# Patient Record
Sex: Male | Born: 1990 | Race: White | Marital: Single | Smoking: Current some day smoker
Health system: Southern US, Community
[De-identification: ages and names within clinical notes are randomized; demographics above are authoritative.]

---

## 2010-12-22 ENCOUNTER — Other Ambulatory Visit: Payer: Self-pay | Admitting: Family Medicine

## 2010-12-22 DIAGNOSIS — M79671 Pain in right foot: Secondary | ICD-10-CM

## 2010-12-22 DIAGNOSIS — M25571 Pain in right ankle and joints of right foot: Secondary | ICD-10-CM

## 2010-12-24 ENCOUNTER — Ambulatory Visit
Admission: RE | Admit: 2010-12-24 | Discharge: 2010-12-24 | Disposition: A | Discharge status: Home / Self Care | Payer: BC Managed Care – PPO | Source: Ambulatory Visit | Attending: Family Medicine | Admitting: Family Medicine

## 2010-12-24 ENCOUNTER — Other Ambulatory Visit: Payer: Self-pay | Admitting: Family Medicine

## 2010-12-24 DIAGNOSIS — M25571 Pain in right ankle and joints of right foot: Secondary | ICD-10-CM

## 2010-12-24 DIAGNOSIS — M79671 Pain in right foot: Secondary | ICD-10-CM

## 2011-11-05 IMAGING — CT CT ANKLE*R* W/O CM
4 series · 17 of 35 positions shown, 19 images · non-contrast
Comparison: None.

CLINICAL DATA: Right foot and ankle pain.  Question posterior
talus fracture.

CT RIGHT FOOT WITHOUT CONTRAST
TECHNIQUE: Multidetector CT imaging of the right foot was
performed according to the standard protocol without intravenous
contrast. Multiplanar CT image reconstructions were also generated.

[Series 2: ankle/foot bone · axial · 0.59mm/px · z∈[-47,+91]mm · 5 of 83 slices shown, 7 images]
[im 14/83  soft-tissue]
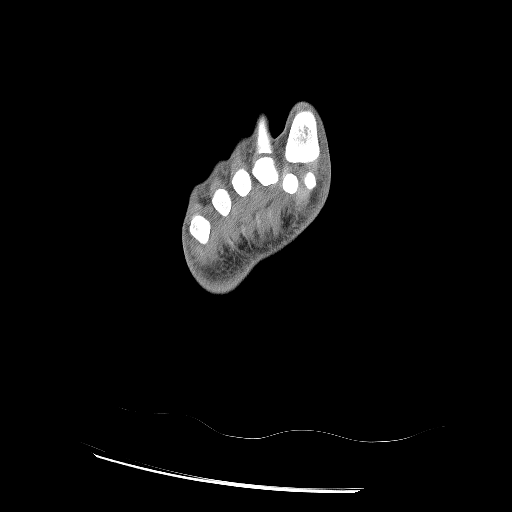
[im 14/83  bone]
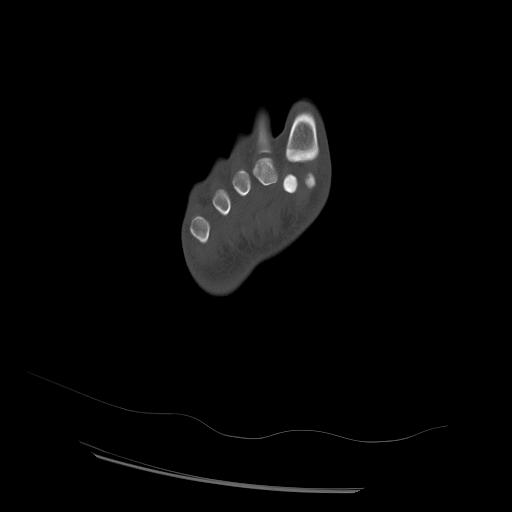
[im 28/83  bone]
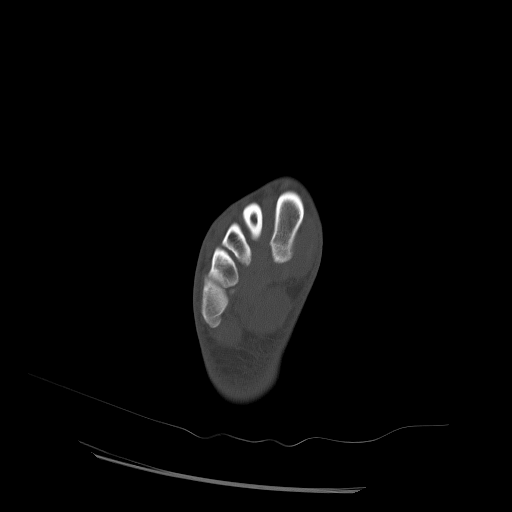
[im 42/83  bone]
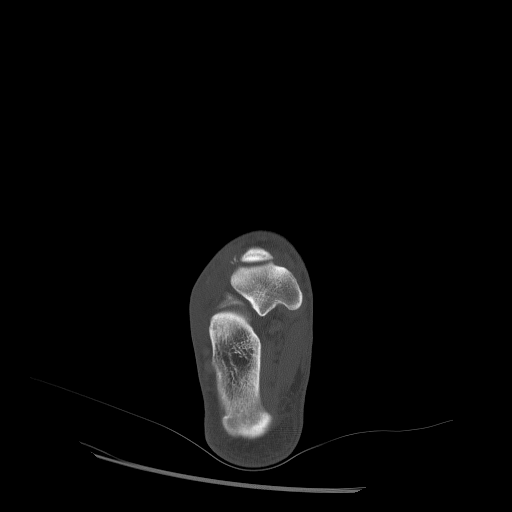
[im 55/83  bone]
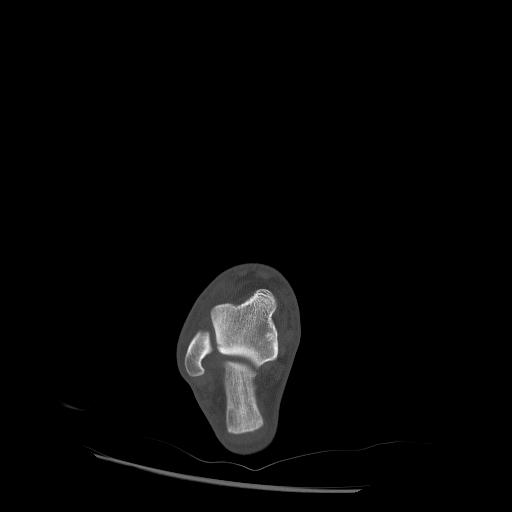
[im 69/83  soft-tissue]
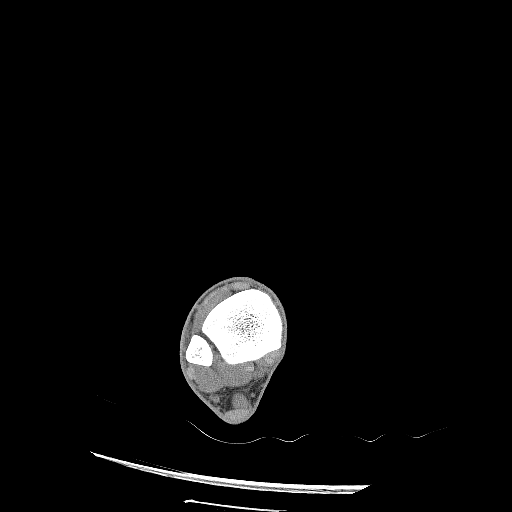
[im 69/83  bone]
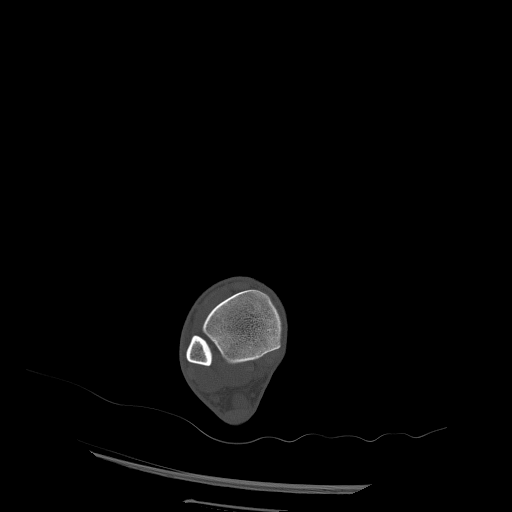

[Series 3: ankle /foot detail · axial · 0.59mm/px · z∈[-47,+23]mm · 3 of 83 slices shown]
[im 14/83  bone]
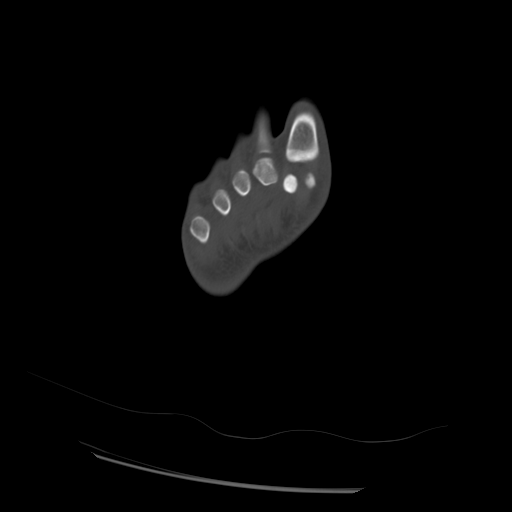
[im 28/83  bone]
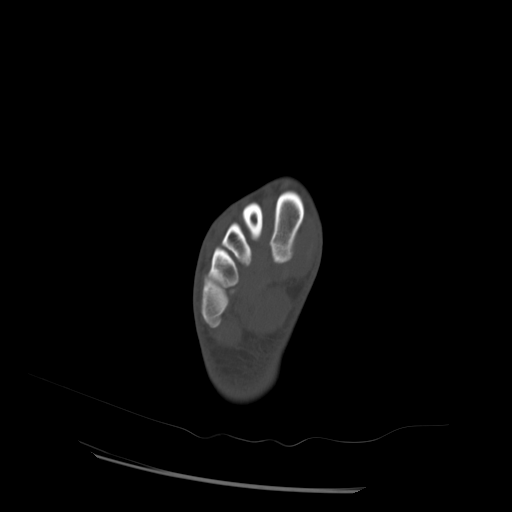
[im 42/83  bone]
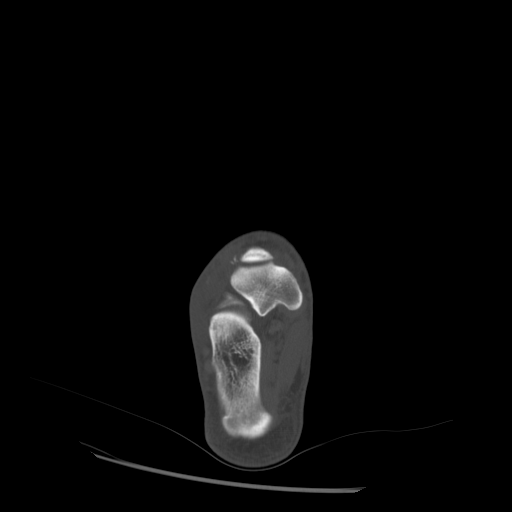

[Series 400: sagittal · sagittal · 0.59mm/px · 6 of 49 slices shown]
[im 17/49  bone]
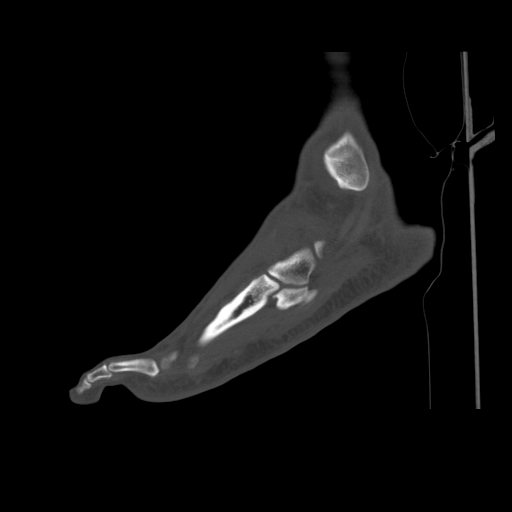
[im 21/49  bone]
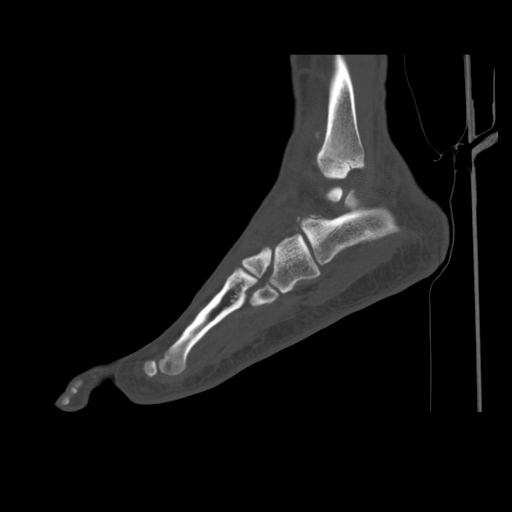
[im 25/49  bone]
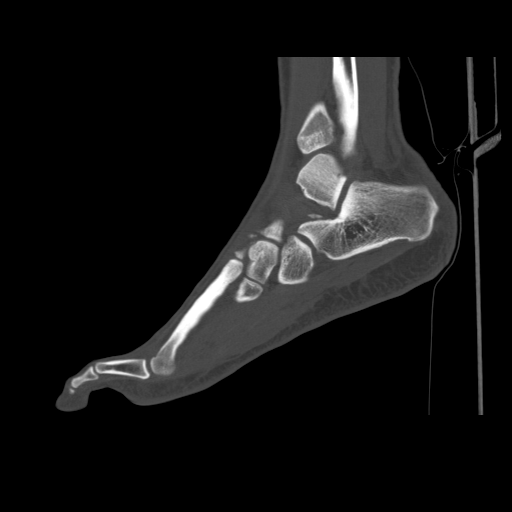
[im 29/49  bone]
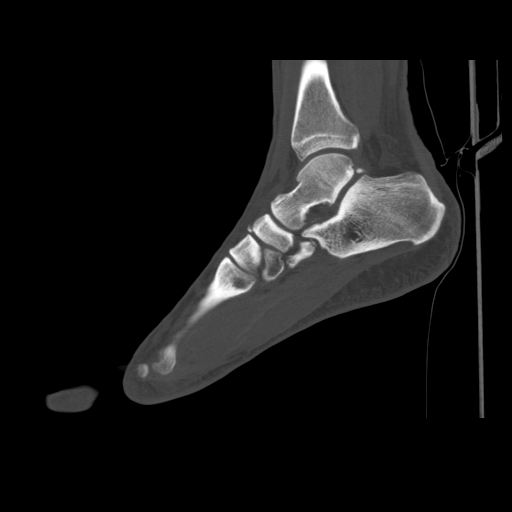
[im 30/49  soft-tissue]
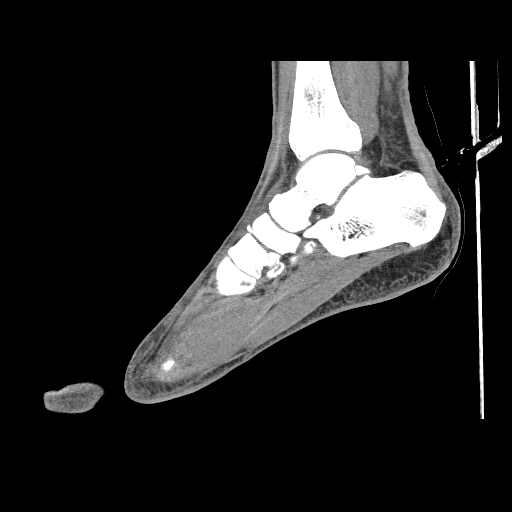
[im 33/49  bone]
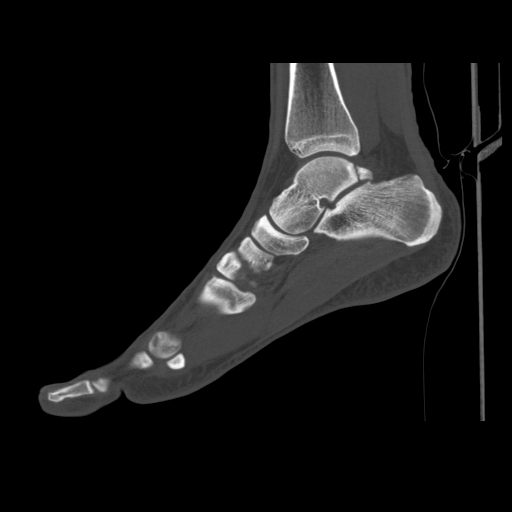

[Series 402: coronal ankle · coronal · 0.39mm/px · 3 of 55 slices shown]
[im 11/55  bone]
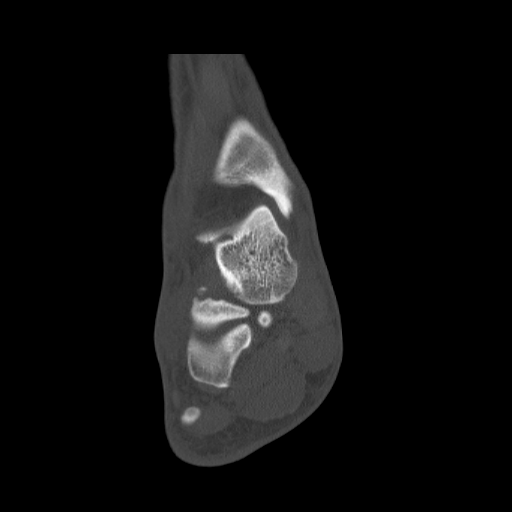
[im 22/55  bone]
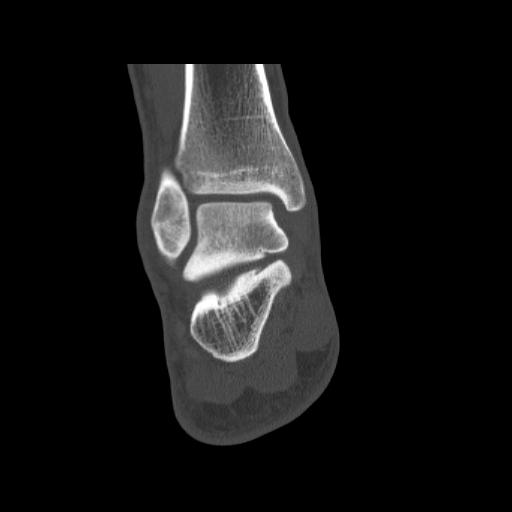
[im 33/55  bone]
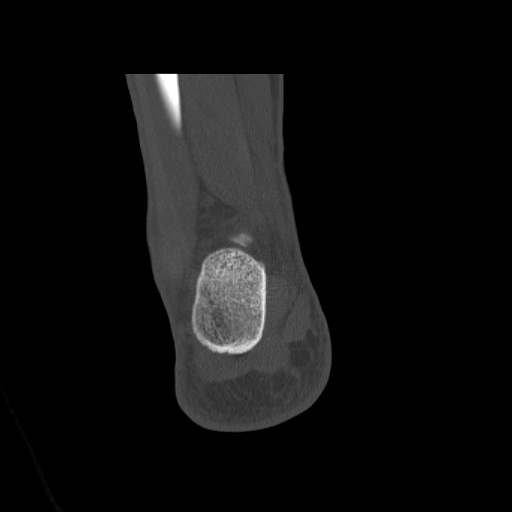

[17 of 35 positions shown; findings below may reference images not displayed]

FINDINGS: There is a cluster of bone fragments in the sinus tarsi
extending lateral to the middle subtalar joint.  These arise from
the anterior process of the calcaneus.

There is also a small acute chip fracture off the dorsal distal
navicular bone.  The talus is intact with a small os trigonum and
mild sclerosis along the synchondrosis.  The talar dome is intact.
The tibial plafond appears within normal limits.  Achilles tendon
and plantar fascia grossly normal by CT.

The posterior subtalar joint appears normal.

The forefoot appears within normal limits.
IMPRESSION: 1.  Minimally displaced small comminuted fracture of the anterior
process of the calcaneus with a small bone fragment in the sinus
tarsi measuring 5 mm x 80 mm x 6 mm.
2.  Small chip fracture of the dorsal distal navicular bone.

## 2013-10-12 ENCOUNTER — Emergency Department (INDEPENDENT_AMBULATORY_CARE_PROVIDER_SITE_OTHER)
Admission: EM | Admit: 2013-10-12 | Discharge: 2013-10-12 | Disposition: A | Discharge status: Home / Self Care | Payer: PRIVATE HEALTH INSURANCE | Source: Home / Self Care

## 2013-10-12 ENCOUNTER — Encounter (HOSPITAL_COMMUNITY): Payer: Self-pay | Admitting: Emergency Medicine

## 2013-10-12 DIAGNOSIS — J111 Influenza due to unidentified influenza virus with other respiratory manifestations: Secondary | ICD-10-CM

## 2013-10-12 NOTE — ED Provider Notes (Signed)
CSN: [id number]     Arrival date & time 10/12/13  [id number] History   First MD Initiated Contact with Patient 10/12/13 [phone]     Chief Complaint  Patient presents with  . URI   (Consider location/radiation/quality/duration/timing/severity/associated sxs/prior Treatment) HPI Comments: Patient states he has flulike symptoms.   History reviewed. No pertinent past medical history. No past surgical history on file. No family history on file. History  Substance Use Topics  . Smoking status: Not on file  . Smokeless tobacco: Not on file  . Alcohol Use: Not on file    Review of Systems  Constitutional: Positive for activity change, appetite change and fatigue. Negative for fever and diaphoresis.  HENT: Positive for postnasal drip and sore throat. Negative for ear pain, facial swelling, rhinorrhea and trouble swallowing.   Eyes: Negative for pain, discharge and redness.  Respiratory: Positive for cough. Negative for chest tightness and shortness of breath.   Cardiovascular: Negative.   Gastrointestinal: Negative.   Musculoskeletal: Negative.  Negative for neck pain and neck stiffness.  Skin: Negative for rash.  Neurological: Negative.     Allergies  Review of patient's allergies indicates no known allergies.  Home Medications  No current outpatient prescriptions on file. BP 144/86  Pulse 87  Temp(Src) 98.1 F (36.7 C) (Oral)  Resp 18  SpO2 98% Physical Exam  Nursing note and vitals reviewed. Constitutional: He is oriented to person, place, and time. He appears well-developed and well-nourished. No distress.  HENT:  Mouth/Throat: No oropharyngeal exudate.  Bilateral TMs are normal Oropharynx with erythema and cobblestoning.  Eyes: Conjunctivae and EOM are normal.  Neck: Normal range of motion. Neck supple.  Cardiovascular: Normal rate, regular rhythm and normal heart sounds.   Pulmonary/Chest: Effort normal and breath sounds normal. No respiratory distress. He has no wheezes. He  has no rales.  Musculoskeletal: Normal range of motion. He exhibits no edema.  Lymphadenopathy:    He has no cervical adenopathy.  Neurological: He is alert and oriented to person, place, and time.  Skin: Skin is warm and dry. No rash noted.  Skin is clammy and with forehead diaphoresis.  Psychiatric: He has a normal mood and affect.    ED Course  Procedures (including critical care time) Labs Review Labs Reviewed - No data to display Imaging Review No results found.    MDM   1. Flu syndrome    Rest, drink plenty of fluids, acetaminophen or ibuprofen when necessary Alka-Seltzer cold plus nighttime reliever Stay home    Hayden Rasmussenavid Chanya Chrisley, NP 10/12/13 [phone]

## 2013-10-12 NOTE — ED Notes (Signed)
C/o cold sx since Thursday States he has body ache, productive cough, no appetite, fever/chills, and diarrhea OTC medications taking and using home remedies but no relief.

## 2013-10-12 NOTE — Discharge Instructions (Signed)
Influenza, Adult Influenza ("the flu") is a viral infection of the respiratory tract. It occurs more often in winter months because people spend more time in close contact with one another. Influenza can make you feel very sick. Influenza easily spreads from person to person (contagious). CAUSES  Influenza is caused by a virus that infects the respiratory tract. You can catch the virus by breathing in droplets from an infected person's cough or sneeze. You can also catch the virus by touching something that was recently contaminated with the virus and then touching your mouth, nose, or eyes. SYMPTOMS  Symptoms typically last 4 to 10 days and may include:  Fever.  Chills.  Headache, body aches, and muscle aches.  Sore throat.  Chest discomfort and cough.  Poor appetite.  Weakness or feeling tired.  Dizziness.  Nausea or vomiting. DIAGNOSIS  Diagnosis of influenza is often made based on your history and a physical exam. A nose or throat swab test can be done to confirm the diagnosis. RISKS AND COMPLICATIONS You may be at risk for a more severe case of influenza if you smoke cigarettes, have diabetes, have chronic heart disease (such as heart failure) or lung disease (such as asthma), or if you have a weakened immune system. Elderly people and pregnant women are also at risk for more serious infections. The most common complication of influenza is a lung infection (pneumonia). Sometimes, this complication can require emergency medical care and may be life-threatening. PREVENTION  An annual influenza vaccination (flu shot) is the best way to avoid getting influenza. An annual flu shot is now routinely recommended for all adults in the [place] TREATMENT  In mild cases, influenza goes away on its own. Treatment is directed at relieving symptoms. For more severe cases, your caregiver may prescribe antiviral medicines to shorten the sickness. Antibiotic medicines are not effective, because the  infection is caused by a virus, not by bacteria. HOME CARE INSTRUCTIONS  Only take over-the-counter or prescription medicines for pain, discomfort, or fever as directed by your caregiver.  Use a cool mist humidifier to make breathing easier.  Get plenty of rest until your temperature returns to normal. This usually takes 3 to 4 days.  Drink enough fluids to keep your urine clear or pale yellow.  Cover your mouth and nose when coughing or sneezing, and wash your hands well to avoid spreading the virus.  Stay home from work or school until your fever has been gone for at least 1 full day. SEEK MEDICAL CARE IF:   You have chest pain or a deep cough that worsens or produces more mucus.  You have nausea, vomiting, or diarrhea. SEEK IMMEDIATE MEDICAL CARE IF:   You have difficulty breathing, shortness of breath, or your skin or nails turn bluish.  You have severe neck pain or stiffness.  You have a severe headache, facial pain, or earache.  You have a worsening or recurring fever.  You have nausea or vomiting that cannot be controlled. MAKE SURE YOU:  Understand these instructions.  Will watch your condition.  Will get help right away if you are not doing well or get worse. Document Released: 09/23/2000 Document Revised: 03/27/2012 Document Reviewed: 12/26/2011 [facility] Patient Information 2014 [place], [place].  Upper Respiratory Infection, Adult An upper respiratory infection (URI) is also sometimes known as the common cold. The upper respiratory tract includes the nose, sinuses, throat, trachea, and bronchi. Bronchi are the airways leading to the lungs. Most people improve within 1 week, but  symptoms can last up to 2 weeks. A residual cough may last even longer.  CAUSES Many different viruses can infect the tissues lining the upper respiratory tract. The tissues become irritated and inflamed and often become very moist. Mucus production is also common. A cold is contagious. You  can easily spread the virus to others by oral contact. This includes kissing, sharing a glass, coughing, or sneezing. Touching your mouth or nose and then touching a surface, which is then touched by another person, can also spread the virus. SYMPTOMS  Symptoms typically develop 1 to 3 days after you come in contact with a cold virus. Symptoms vary from person to person. They may include:  Runny nose.  Sneezing.  Nasal congestion.  Sinus irritation.  Sore throat.  Loss of voice (laryngitis).  Cough.  Fatigue.  Muscle aches.  Loss of appetite.  Headache.  Low-grade fever. DIAGNOSIS  You might diagnose your own cold based on familiar symptoms, since most people get a cold 2 to 3 times a year. Your caregiver can confirm this based on your exam. Most importantly, your caregiver can check that your symptoms are not due to another disease such as strep throat, sinusitis, pneumonia, asthma, or epiglottitis. Blood tests, throat tests, and X-rays are not necessary to diagnose a common cold, but they may sometimes be helpful in excluding other more serious diseases. Your caregiver will decide if any further tests are required. RISKS AND COMPLICATIONS  You may be at risk for a more severe case of the common cold if you smoke cigarettes, have chronic heart disease (such as heart failure) or lung disease (such as asthma), or if you have a weakened immune system. The very young and very old are also at risk for more serious infections. Bacterial sinusitis, middle ear infections, and bacterial pneumonia can complicate the common cold. The common cold can worsen asthma and chronic obstructive pulmonary disease (COPD). Sometimes, these complications can require emergency medical care and may be life-threatening. PREVENTION  The best way to protect against getting a cold is to practice good hygiene. Avoid oral or hand contact with people with cold symptoms. Wash your hands often if contact occurs.  There is no clear evidence that vitamin C, vitamin E, echinacea, or exercise reduces the chance of developing a cold. However, it is always recommended to get plenty of rest and practice good nutrition. TREATMENT  Treatment is directed at relieving symptoms. There is no cure. Antibiotics are not effective, because the infection is caused by a virus, not by bacteria. Treatment may include:  Increased fluid intake. Sports drinks offer valuable electrolytes, sugars, and fluids.  Breathing heated mist or steam (vaporizer or shower).  Eating chicken soup or other clear broths, and maintaining good nutrition.  Getting plenty of rest.  Using gargles or lozenges for comfort.  Controlling fevers with ibuprofen or acetaminophen as directed by your caregiver.  Increasing usage of your inhaler if you have asthma. Zinc gel and zinc lozenges, taken in the first 24 hours of the common cold, can shorten the duration and lessen the severity of symptoms. Pain medicines may help with fever, muscle aches, and throat pain. A variety of non-prescription medicines are available to treat congestion and runny nose. Your caregiver can make recommendations and may suggest nasal or lung inhalers for other symptoms.  HOME CARE INSTRUCTIONS   Only take over-the-counter or prescription medicines for pain, discomfort, or fever as directed by your caregiver.  Use a warm mist humidifier or inhale  steam from a shower to increase air moisture. This may keep secretions moist and make it easier to breathe.  Drink enough water and fluids to keep your urine clear or pale yellow.  Rest as needed.  Return to work when your temperature has returned to normal or as your caregiver advises. You may need to stay home longer to avoid infecting others. You can also use a face mask and careful hand washing to prevent spread of the virus. SEEK MEDICAL CARE IF:   After the first few days, you feel you are getting worse rather than  better.  You need your caregiver's advice about medicines to control symptoms.  You develop chills, worsening shortness of breath, or brown or red sputum. These may be signs of pneumonia.  You develop yellow or brown nasal discharge or pain in the face, especially when you bend forward. These may be signs of sinusitis.  You develop a fever, swollen neck glands, pain with swallowing, or white areas in the back of your throat. These may be signs of strep throat. SEEK IMMEDIATE MEDICAL CARE IF:   You have a fever.  You develop severe or persistent headache, ear pain, sinus pain, or chest pain.  You develop wheezing, a prolonged cough, cough up blood, or have a change in your usual mucus (if you have chronic lung disease).  You develop sore muscles or a stiff neck. Document Released: 03/22/2001 Document Revised: 12/19/2011 Document Reviewed: 01/28/2011 [facility] Patient Information 2014 [place], [place].

## 2013-10-15 NOTE — ED Provider Notes (Signed)
Medical screening examination/treatment/procedure(s) were performed by resident physician or non-physician practitioner and as supervising physician I was immediately available for consultation/collaboration.   Christon Gallaway DOUGLAS MD.   Abby Stines D Cornie Herrington, MD 10/15/13 0845 

## 2015-03-12 ENCOUNTER — Encounter (HOSPITAL_COMMUNITY): Payer: Self-pay | Admitting: Emergency Medicine

## 2015-03-12 ENCOUNTER — Emergency Department (HOSPITAL_COMMUNITY)
Admission: EM | Admit: 2015-03-12 | Discharge: 2015-03-12 | Disposition: A | Discharge status: Home / Self Care | Payer: BLUE CROSS/BLUE SHIELD | Source: Home / Self Care | Attending: Family Medicine | Admitting: Family Medicine

## 2015-03-12 DIAGNOSIS — S39012A Strain of muscle, fascia and tendon of lower back, initial encounter: Secondary | ICD-10-CM

## 2015-03-12 MED ORDER — METHOCARBAMOL 500 MG PO TABS: 500.0000 mg | ORAL_TABLET | Freq: Four times a day (QID) | ORAL | Status: AC | PRN

## 2015-03-12 MED ORDER — DICLOFENAC SODIUM 75 MG PO TBEC: 75.0000 mg | DELAYED_RELEASE_TABLET | Freq: Two times a day (BID) | ORAL | Status: AC

## 2015-03-12 NOTE — ED Provider Notes (Signed)
CSN: [id number]     Arrival date & time 03/12/15  [id number] History   First MD Initiated Contact with Patient 03/12/15 1016     Chief Complaint  Patient presents with  . Back Injury   (Consider location/radiation/quality/duration/timing/severity/associated sxs/prior Treatment) HPI  Wt lifting. Power clings 185. When pt tried getting weight from shoulder height to above head felt a pop in lower back. Did 2 more reps but continued to feel "off." pain getting worse. Feeling stiff. Bilat. Improves w/ pressure, stretching. Tylenol 1000 mg and ice.  Denies radiation of the pain down the legs on the buttock region. Denies loss of bowel or bladder function.  History reviewed. No pertinent past medical history. History reviewed. No pertinent past surgical history. Family History  Problem Relation Age of Onset  . Cancer Neg Hx   . Diabetes Neg Hx   . Heart failure Neg Hx    History  Substance Use Topics  . Smoking status: Current Some Day Smoker  . Smokeless tobacco: Not on file  . Alcohol Use: Yes    Review of Systems Per HPI with all other pertinent systems negative.   Allergies  Review of patient's allergies indicates no known allergies.  Home Medications   Prior to Admission medications   Medication Sig Start Date End Date Taking? Authorizing Provider  diclofenac (VOLTAREN) 75 MG EC tablet Take 1 tablet (75 mg total) by mouth 2 (two) times daily. 03/12/15   Ozella Rocksavid J Merrell, MD  methocarbamol (ROBAXIN) 500 MG tablet Take 1-2 tablets (500-1,000 mg total) by mouth every 6 (six) hours as needed for muscle spasms. 03/12/15   Ozella Rocksavid J Merrell, MD   BP 113/70 mmHg  Pulse 67  Temp(Src) 98 F (36.7 C) (Oral)  Resp 14  SpO2 97% Physical Exam Physical Exam  Constitutional: oriented to person, place, and time. appears well-developed and well-nourished. No distress.  HENT:  Head: Normocephalic and atraumatic.  Eyes: EOMI. PERRL.  Neck: Normal range of motion.  Cardiovascular: RRR, no m/r/g,  2+ distal pulses,  Pulmonary/Chest: Effort normal and breath sounds normal. No respiratory distress.  Abdominal: Soft. Bowel sounds are normal. NonTTP, no distension.  Musculoskeletal: He is a very muscular athletic individual. Back with full range of motion, lumbar paraspinal muscle tightness and tenderness to palpation.  Neurological: alert and oriented to person, place, and time.  Skin: Skin is warm. No rash noted. non diaphoretic.  Psychiatric: normal mood and affect. behavior is normal. Judgment and thought content normal.   ED Course  Procedures (including critical care time) Labs Review Labs Reviewed - No data to display  Imaging Review No results found.   MDM   1. Back strain, initial encounter    Voltaren, Robaxin, back exercises explained at length, stay active.    Ozella Rocksavid J Merrell, MD 03/12/15 1040

## 2015-03-12 NOTE — Discharge Instructions (Signed)
You have developed back strain and spasm of the paraspinal lumbar muscles. This will take sometime to resolve. Please stay active but let pain be your guide. Please use the Voltaren as an anti-pain and anti-inflammatory medicine. Please use the Robaxin for additional muscle spasm relief. Please remember to massage the area regularly, apply heat multiple times per day, and perform slow purposeful back stretches multiple times per day. There is no sign of permanent injury such as slipped or herniated discs which are causing serious nerve impingement. [facility] or one of the orthopedic surgeon for some reason you do not improve in another 1-2 weeks.

## 2015-03-12 NOTE — ED Notes (Signed)
Reports doing a dead lift from floor up to chest and upon standing straight felt pain in lower back.  States "having a hard time walking and sitting due to pain".   Incident happened last night around 6 p.m.   Mild relief with using heat and ice this a.m.

## 2021-12-29 ENCOUNTER — Ambulatory Visit (INDEPENDENT_AMBULATORY_CARE_PROVIDER_SITE_OTHER): Payer: 59 | Admitting: Psychology

## 2021-12-29 DIAGNOSIS — F4323 Adjustment disorder with mixed anxiety and depressed mood: Secondary | ICD-10-CM | POA: Diagnosis not present
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
## 2022-01-13 ENCOUNTER — Ambulatory Visit (INDEPENDENT_AMBULATORY_CARE_PROVIDER_SITE_OTHER): Payer: 59 | Admitting: Psychology

## 2022-01-13 DIAGNOSIS — F4323 Adjustment disorder with mixed anxiety and depressed mood: Secondary | ICD-10-CM

## 2022-01-13 NOTE — Progress Notes (Signed)
[place] Counselor/Therapist Progress Note ? ?Patient ID: Joel Dunn, MRN: [id number],   ? ?Date: 01/13/2022 ? ?Time Spent: 45 mins ? ?Treatment Type: Individual Therapy ? ?Reported Symptoms: Pt presents today for a follow up session, via webex video, due to virus outbreak.  Pt grants consent for session, stating that he is in a courtyard at work with no one else around.  I shared with pt that I am in my office at home with no one else here with me. ? ?Mental Status Exam: ?Appearance:  Casual     ?Behavior: Appropriate  ?Motor: Normal  ?Speech/Language:  Clear and Coherent  ?Affect: Appropriate  ?Mood: normal  ?Thought process: normal  ?Thought content:   WNL  ?Sensory/Perceptual disturbances:   WNL  ?Orientation: oriented to person, place, and time/date  ?Attention: Good  ?Concentration: Good  ?Memory: WNL  ?Fund of knowledge:  Good  ?Insight:   Good  ?Judgment:  Good  ?Impulse Control: Good  ? ?Risk Assessment: ?Danger to Self:  No ?Self-injurious Behavior: No ?Danger to Others: No ?Duty to Warn:no ?Physical Aggression / Violence:No  ?Access to Firearms a concern: No  ?Gang Involvement:No  ? ?Subjective: Pt shares that he and his wife have been participating lots in the TV show, "[employer] Fat [employer]ulous Life".  They are both being paid for their work this year.  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  He is responsible for Inventory and Shipping; is title is Advertising account executive".  Pt shares he has been working out daily (normally for 1.5 to 2 hrs per day; he goes to his father-in-law's gym and Janett Billow is a trainer there as well); he also has been working on their home and mowing the yard.  All of these activities are fun for pt.  Pt and Janett Billow have been together for 7 years and are going to celebrate their first wedding anniversary on 4/16.  Pt thinks the TV show will do something special for them for their anniversary.  Pt shares that his  parents just celebrated their 33rd wedding anniversary.  Pt shares that Janett Billow helps him with chores around the house.  Pt shares that Janett Billow has been struggling with endometriosis; she is having an appt with a specialist on 6/6 and is on a cancellation list as well.  Pt shares that his sleep continues to be pretty good; he tracks it with his watch.  Pt shares his paternal grandparents are both passed away due to health reasons during his freshman year in college.  Asked pt to tell me the stories of his grandparents at our next session.  Pt shares that he believes he did not adequately grieve the loss of his grandparents because he was away at school and playing football.  We will talk more about this at our follow up session in 2 wks.  ? ?Interventions: Cognitive Behavioral Therapy ? ?Diagnosis:Adjustment disorder with mixed anxiety and depressed mood ? ?Plan: Treatment Plan ?Strengths/Abilities:  Intelligent, Intuitive, Willing to participate in therapy ?Treatment Preferences:  Outpatient Individual Therapy ?Statement of Needs:  Patient is to use CBT, mindfulness and coping skills to help manage and/or decrease symptoms associated with their diagnosis. ?Symptoms:  Depressed/Irritable mood, worry, social withdrawal ?Problems Addressed:  Depressive thoughts, Sadness, Sleep issues, etc. ?Long Term Goals:  Pt to reduce overall level, frequency, and intensity of the feelings of depression/anxiety as evidenced by decreased irritability, negative self talk, and helpless feelings  from 6 to 7 days/week to 0 to 1 days/week, per client report, for at least 3 consecutive months.  Progress: 10% ?Short Term Goals:  Pt to verbally express understanding of the relationship between feelings of depression/anxiety and their impact on thinking patterns and behaviors.  Pt to verbalize an understanding of the role that distorted thinking plays in creating fears, excessive worry, and ruminations.  Progress: 10% ?Target Date:   01/13/2023 ?Frequency:  Bi-weekly ?Modality:  Cognitive Behavioral Therapy ?Interventions by Therapist:  Therapist will use CBT, Mindfulness exercises, Coping skills and Referrals, as needed by client. ?Client has verbally approved this treatment plan. ? ?Ivan Anchors, [facility] ?

## 2022-01-26 ENCOUNTER — Ambulatory Visit (INDEPENDENT_AMBULATORY_CARE_PROVIDER_SITE_OTHER): Payer: 59 | Admitting: Psychology

## 2022-01-26 DIAGNOSIS — F4323 Adjustment disorder with mixed anxiety and depressed mood: Secondary | ICD-10-CM

## 2022-01-26 NOTE — Progress Notes (Signed)
[facility] Counselor/Therapist Progress Note ? ?Patient ID: Joel Dunn, MRN: [id number],   ? ?Date: 01/26/2022 ? ?Time Spent: 45 mins ? ?Treatment Type: Individual Therapy ? ?Reported Symptoms: Pt presents today for a follow up session, via webex video, due to virus outbreak.  Pt grants consent for session, stating that he is on his front porch at home with no one else around.  I shared with pt that I am in my office at home with no one else here with me. ? ?Mental Status Exam: ?Appearance:  Casual     ?Behavior: Appropriate  ?Motor: Normal  ?Speech/Language:  Clear and Coherent  ?Affect: Appropriate  ?Mood: normal  ?Thought process: normal  ?Thought content:   WNL  ?Sensory/Perceptual disturbances:   WNL  ?Orientation: oriented to person, place, and time/date  ?Attention: Good  ?Concentration: Good  ?Memory: WNL  ?Fund of knowledge:  Good  ?Insight:   Good  ?Judgment:  Good  ?Impulse Control: Good  ? ?Risk Assessment: ?Danger to Self:  No ?Self-injurious Behavior: No ?Danger to Others: No ?Duty to Warn:no ?Physical Aggression / Violence:No  ?Access to Firearms a concern: No  ?Gang Involvement:No  ? ?Subjective: Pt shares that he has been working in his yard a lot since our last session and he enjoyed that.  Pt shares that his grandmother passed away during his freshman year in college; she had attempted suicide earlier in her life (shot herself in the chest with a shotgun and lost one of her lungs), when his dad was a teenager.  He has lots of good memories with his grandparents.  His grandmother made him his very first suit and that was meaningful for him.  He lost his grandfather two years ago; he had kidney issues.  He remembers lots of firsts with his grandparents (first bee sting, first bicycle wreck, etc.).  He got a little sad when he was thinking about these memories.  He was the first [place] family member to graduate from college.  Pt is "sad that I was not there when my grandmother  passed.  I am sad that I was not there to say goodbye."  Talked with pt about his grandmother being in heaven; she would want pt to not be upset with himself.  She would want him to enjoy his life and all that he has earned.  His grandfather was a very hard working person and loved pt very much.  Pt's maternal grandparents live in [place]; pt and his sister and going with their mom to [place] this weekend for a surprise 80th birthday for his grandfather.  Pt has not seen his grandparents since his wedding one year ago.  Encouraged pt to continue to think about what his grandmother would want for him; would she want him to hold himself responsible for anything that he could not control.  He acknowledges that she would not want that.  We will talk more about the grieving process in our next session in 2 wks. ? ? ?Interventions: Cognitive Behavioral Therapy ? ?Diagnosis:Adjustment disorder with mixed anxiety and depressed mood ? ?Plan: Treatment Plan ?Strengths/Abilities:  Intelligent, Intuitive, Willing to participate in therapy ?Treatment Preferences:  Outpatient Individual Therapy ?Statement of Needs:  Patient is to use CBT, mindfulness and coping skills to help manage and/or decrease symptoms associated with their diagnosis. ?Symptoms:  Depressed/Irritable mood, worry, social withdrawal ?Problems Addressed:  Depressive thoughts, Sadness, Sleep issues, etc. ?Long Term Goals:  Pt to reduce overall level, frequency, and intensity of  the feelings of depression/anxiety as evidenced by decreased irritability, negative self talk, and helpless feelings from 6 to 7 days/week to 0 to 1 days/week, per client report, for at least 3 consecutive months.  Progress: 10% ?Short Term Goals:  Pt to verbally express understanding of the relationship between feelings of depression/anxiety and their impact on thinking patterns and behaviors.  Pt to verbalize an understanding of the role that distorted thinking plays in creating fears, excessive  worry, and ruminations.  Progress: 10% ?Target Date:  01/13/2023 ?Frequency:  Bi-weekly ?Modality:  Cognitive Behavioral Therapy ?Interventions by Therapist:  Therapist will use CBT, Mindfulness exercises, Coping skills and Referrals, as needed by client. ?Client has verbally approved this treatment plan. ? ?Ivan Anchors, [facility] ?

## 2022-02-09 ENCOUNTER — Ambulatory Visit (INDEPENDENT_AMBULATORY_CARE_PROVIDER_SITE_OTHER): Payer: 59 | Admitting: Psychology

## 2022-02-09 DIAGNOSIS — F4323 Adjustment disorder with mixed anxiety and depressed mood: Secondary | ICD-10-CM

## 2022-02-09 NOTE — Progress Notes (Signed)
[facility] Counselor/Therapist Progress Note ? ?Patient ID: Joel Dunn, MRN: [id number],   ? ?Date: 02/09/2022 ? ?Time Spent: 45 mins ? ?Treatment Type: Individual Therapy ? ?Reported Symptoms: Pt presents today for a follow up session, via webex video, due to virus outbreak.  Pt grants consent for session, stating that he is in his office at work with no one else around.  I shared with pt that I am in my office at home with no one else here with me. ? ?Mental Status Exam: ?Appearance:  Casual     ?Behavior: Appropriate  ?Motor: Normal  ?Speech/Language:  Clear and Coherent  ?Affect: Appropriate  ?Mood: normal  ?Thought process: normal  ?Thought content:   WNL  ?Sensory/Perceptual disturbances:   WNL  ?Orientation: oriented to person, place, and time/date  ?Attention: Good  ?Concentration: Good  ?Memory: WNL  ?Fund of knowledge:  Good  ?Insight:   Good  ?Judgment:  Good  ?Impulse Control: Good  ? ?Risk Assessment: ?Danger to Self:  No ?Self-injurious Behavior: No ?Danger to Others: No ?Duty to Warn:no ?Physical Aggression / Violence:No  ?Access to Firearms a concern: No  ?Gang Involvement:No  ? 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  He has not been paid for working on it but is getting paid this year.  Talked with pt some more about his grief process with the loss of his grandmother.  We talked last session about giving himself grace for not getting to her bedside before she passed.  He is working on that concept and getting more comfortable with it.  Pt shares that a good friend of his just got out of prison for stealing and selling guns.  Pt realizes that every person is a human being and should  be respected; he feels this comes from pt being bi-racial.  Pt shares that  his sister is working for an Art gallery manager and now has her own apt.  Shared with pt that I appreciated meeting his wife Joel Dunn) last session.  Pt continues to work out regularly, he also is working with the guy who is training pt's bird dog Insurance risk surveyor), he is also planning to go [place] hunting with the same guy.  Asked pt to tell me about his dad.  His dad was always his football and wrestling coach.  He was always working hard for their family.  His dad went to college for 2 yrs on a football scholarship to [employer]; he left after the 2 yrs "because he was hanging with the wrong crowd and got into trouble.  Encouraged pt to continue with his self care activities and we will meet in 2 wks for a follow up session. ? ? ?Interventions: Cognitive Behavioral Therapy ? ?Diagnosis:Adjustment disorder with mixed anxiety and depressed mood ? ?Plan: Treatment Plan ?Strengths/Abilities:  Intelligent, Intuitive, Willing to participate in therapy ?Treatment Preferences:  Outpatient Individual Therapy ?Statement of Needs:  Patient is to use CBT, mindfulness and coping skills to help manage and/or decrease symptoms associated with their diagnosis. ?Symptoms:  Depressed/Irritable mood, worry, social withdrawal ?Problems Addressed:  Depressive thoughts, Sadness, Sleep issues, etc. ?Long Term Goals:  Pt to reduce overall level, frequency, and intensity of the feelings of depression/anxiety as evidenced by decreased irritability, negative self talk,  and helpless feelings from 6 to 7 days/week to 0 to 1 days/week, per client report, for at least 3 consecutive months.  Progress: 10% ?Short Term Goals:  Pt to verbally express understanding of the relationship between feelings of depression/anxiety and their impact on thinking patterns and behaviors.  Pt to verbalize an understanding of the role that distorted thinking plays in creating fears, excessive worry, and ruminations.  Progress: 10% ?Target Date:  01/13/2023 ?Frequency:   Bi-weekly ?Modality:  Cognitive Behavioral Therapy ?Interventions by Therapist:  Therapist will use CBT, Mindfulness exercises, Coping skills and Referrals, as needed by client. ?Client has verbally approved this treatment plan. ? ?Joel Dunn, [facility] ?

## 2022-02-24 ENCOUNTER — Ambulatory Visit: Payer: 59 | Admitting: Psychology

## 2022-04-07 ENCOUNTER — Ambulatory Visit (INDEPENDENT_AMBULATORY_CARE_PROVIDER_SITE_OTHER): Payer: 59 | Admitting: Psychology

## 2022-04-07 DIAGNOSIS — F4323 Adjustment disorder with mixed anxiety and depressed mood: Secondary | ICD-10-CM | POA: Diagnosis not present

## 2022-04-07 NOTE — Progress Notes (Signed)
[facility] Counselor/Therapist Progress Note  Patient ID: Joel Dunn, MRN: [id number],    Date: 04/07/2022  Time Spent: 45 mins  Treatment Type: Individual Therapy  Reported Symptoms: Pt presents today for a follow up session, via webex video.  Pt grants consent for session, stating that he is in his office at work with no one else around.  I shared with pt that I am in my office at home with no one else here with me.  Mental Status Exam: Appearance:  Casual     Behavior: Appropriate  Motor: Normal  Speech/Language:  Clear and Coherent  Affect: Appropriate  Mood: normal  Thought process: normal  Thought content:   WNL  Sensory/Perceptual disturbances:   WNL  Orientation: oriented to person, place, and time/date  Attention: Good  Concentration: Good  Memory: WNL  Fund of knowledge:  Good  Insight:   Good  Judgment:  Good  Impulse Control: Good   Risk Assessment: Danger to Self:  No Self-injurious Behavior: No Danger to Others: No Duty to Warn:no Physical Aggression / Violence:No  Access to Firearms a concern: No  Gang Involvement:No   Subjective: Pt shares that he and Jess went to [place] for a vacation earlier in June.  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  Pt shares that his father-in-law had to have lung surgery (for the third time).  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  The surgeries have been in the past 6 yrs.  He continues to be followed by specialists on a regular basis.  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  Pt enjoys personal training.  Pt shares his dogs are doing fine and he enjoys working with them.  Pt shares that Jess went to the doctor recently and she checked out well; she had an ultrasound and blood work and those both checked out.  They are having trouble with fertility and pt is going to a specialist next week for his own check up.  They have been married for a year and a half and they have been together for 7 yrs.  Pt shares, "We are ready to start a family."  Pt shares that his mom and dad are doing well.  His mom has started to exercise more regularly.  He is trying to get his dad to work out as well.  Pt continues to work on the concept of giving himself grace as it come to mind.  Encouraged pt to continue with his self care activities and we will meet in 2 wks for a follow up session.   Interventions: Cognitive Behavioral Therapy  Diagnosis:Adjustment disorder with mixed anxiety and depressed mood  Plan: Treatment Plan Strengths/Abilities:  Intelligent, Intuitive, Willing to participate in therapy Treatment Preferences:  Outpatient Individual Therapy Statement of Needs:  Patient is to use CBT, mindfulness and coping skills to help manage and/or decrease symptoms associated with their diagnosis. Symptoms:  Depressed/Irritable  mood, worry, social withdrawal Problems Addressed:  Depressive thoughts, Sadness, Sleep issues, etc. Long Term Goals:  Pt to reduce overall level, frequency, and intensity of the feelings of depression/anxiety as evidenced by decreased irritability, negative self talk, and helpless feelings from 6 to 7 days/week to 0 to 1 days/week, per client report, for at least 3 consecutive months.  Progress: 10% Short Term Goals:  Pt to verbally express understanding of the relationship between feelings of depression/anxiety and their impact on thinking patterns and behaviors.  Pt to verbalize an understanding of the role that  distorted thinking plays in creating fears, excessive worry, and ruminations.  Progress: 10% Target Date:  01/13/2023 Frequency:  Bi-weekly Modality:  Cognitive Behavioral Therapy Interventions by Therapist:  Therapist will use CBT, Mindfulness exercises, Coping skills and Referrals, as needed by client. Client has verbally approved this treatment plan.  [name], [facility]

## 2022-04-20 ENCOUNTER — Ambulatory Visit (INDEPENDENT_AMBULATORY_CARE_PROVIDER_SITE_OTHER): Payer: 59 | Admitting: Psychology

## 2022-04-20 DIAGNOSIS — F4323 Adjustment disorder with mixed anxiety and depressed mood: Secondary | ICD-10-CM | POA: Diagnosis not present

## 2022-04-20 NOTE — Progress Notes (Signed)
[facility] Counselor/Therapist Progress Note  Patient ID: Jewell Ryans, MRN: [id number],    Date: 04/20/2022  Time Spent: 45 mins  Treatment Type: Individual Therapy  Reported Symptoms: Pt presents today for a follow up session, via webex video.  Pt grants consent for session, stating that he is in his office at work with no one else around.  I shared with pt that I am in my office at home with no one else here with me.  Mental Status Exam: Appearance:  Casual     Behavior: Appropriate  Motor: Normal  Speech/Language:  Clear and Coherent  Affect: Appropriate  Mood: normal  Thought process: normal  Thought content:   WNL  Sensory/Perceptual disturbances:   WNL  Orientation: oriented to person, place, and time/date  Attention: Good  Concentration: Good  Memory: WNL  Fund of knowledge:  Good  Insight:   Good  Judgment:  Good  Impulse Control: Good   Risk Assessment: Danger to Self:  No Self-injurious Behavior: No Danger to Others: No Duty to Warn:no Physical Aggression / Violence:No  Access to Firearms a concern: No  Gang Involvement:No   Subjective: Pt shares that he has been doing well.  He shares that he has been cleared from his fertility specialist visit.  They go back to [facility] Oliphant' doctor on 7/19 for a follow up session.  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  Pt shares that he continues to work out consistently, they are planning a beach trip in Sept, he is also working with a male sheriffs' deputy who helped him build a rifle and will teach him how to shoot it and clean it, etc.  Pt shares his dogs are doing fine and he enjoys working with them.  Pt shares that he has concern for his mentor and his mobility issues  and wants to find a way to talk with his mentor about pt's concern for him.  We talked about ways to present that concern to his mentor in a supportive way as a means of encouraging him to see a PT or OT.  Encouraged pt to continue with his self care activities and we will meet in 2 wks for a follow up session.   Interventions: Cognitive Behavioral Therapy  Diagnosis:Adjustment disorder with mixed anxiety and depressed mood  Plan: Treatment Plan Strengths/Abilities:  Intelligent, Intuitive, Willing to participate in therapy Treatment Preferences:  Outpatient Individual Therapy Statement of Needs:  Patient is to use CBT, mindfulness and coping skills to help manage and/or decrease symptoms associated with their diagnosis. Symptoms:  Depressed/Irritable mood, worry, social withdrawal Problems Addressed:  Depressive thoughts, Sadness, Sleep issues, etc. Long Term Goals:  Pt to reduce overall level, frequency, and intensity of the feelings of depression/anxiety as evidenced by decreased irritability, negative self talk, and helpless feelings from 6 to 7 days/week to 0 to 1 days/week, per client report, for at least 3 consecutive months.  Progress: 10% Short Term Goals:  Pt to verbally express understanding of the relationship between feelings of depression/anxiety and their impact on thinking patterns and behaviors.  Pt to verbalize an understanding of the role that distorted thinking plays in creating fears, excessive worry, and ruminations.  Progress: 10% Target  Date:  01/13/2023 Frequency:  Bi-weekly Modality:  Cognitive Behavioral Therapy Interventions by Therapist:  Therapist will use CBT, Mindfulness exercises, Coping skills and Referrals, as needed by client. Client has verbally approved this treatment plan.  [name], [facility]

## 2022-05-04 ENCOUNTER — Ambulatory Visit (INDEPENDENT_AMBULATORY_CARE_PROVIDER_SITE_OTHER): Payer: 59 | Admitting: Psychology

## 2022-05-04 DIAGNOSIS — F4323 Adjustment disorder with mixed anxiety and depressed mood: Secondary | ICD-10-CM

## 2022-05-04 NOTE — Progress Notes (Signed)
[facility] Counselor/Therapist Progress Note  Patient ID: Joel Dunn, MRN: [id number],    Date: 05/04/2022  Time Spent: 45 mins  Treatment Type: Individual Therapy  Reported Symptoms: Pt presents today for a follow up session, via webex video.  Pt grants consent for session, stating that he is in his office at work with no one else around.  I shared with pt that I am in my office at home with no one else here with me.  Mental Status Exam: Appearance:  Casual     Behavior: Appropriate  Motor: Normal  Speech/Language:  Clear and Coherent  Affect: Appropriate  Mood: normal  Thought process: normal  Thought content:   WNL  Sensory/Perceptual disturbances:   WNL  Orientation: oriented to person, place, and time/date  Attention: Good  Concentration: Good  Memory: WNL  Fund of knowledge:  Good  Insight:   Good  Judgment:  Good  Impulse Control: Good   Risk Assessment: Danger to Self:  No Self-injurious Behavior: No Danger to Others: No Duty to Warn:no Physical Aggression / Violence:No  Access to Firearms a concern: No  Gang Involvement:No   Subjective: Pt shares that he has been doing well.  He continues to work hard, both at work and in [employer]  He has enjoyed this workouts and helped one of his friends move this past weekend.  His friend is moving in with his girlfriend for the first time.  He has continued to work in his garden at home; they have planted green beans, watermelons, and tomatoes.  He learned to garden from his mom and Joel Dunn learned from her grandfather 33 yo) and her parents.  Her grandfather still makes molasses and he has shown them how to make them too.  Pt shares that he and Joel Dunn went back to the Ob/Gyn last week and she will need to have some additional testing.  They are changing their insurance coverage so they will have to wait until Sept to get the dye test done.  Pt shares that work has continued to be busy and he enjoys that.  He shares  his mentor is beginning to feel a little bit better; pt talked with his mentor's wife about his health issues and they are working toward supporting him to get to a doctor for additional care and support.  Pt shares he has been eating cleaner for himself lately; he is trying to cut back on the amount of sugar that he consumes.  He is trying to eat his 6 smaller meals per day and that seems to be benefiting him.  He is also playing with his dogs regularly and he enjoys interacting with them.  Encouraged pt to continue with his self care activities and we will meet in 2 wks for a follow up session.   Interventions: Cognitive Behavioral Therapy  Diagnosis:Adjustment disorder with mixed anxiety and depressed mood  Plan: Treatment Plan Strengths/Abilities:  Intelligent, Intuitive, Willing to participate in therapy Treatment Preferences:  Outpatient Individual Therapy Statement of Needs:  Patient is to use CBT, mindfulness and coping skills to help manage and/or decrease symptoms associated with their diagnosis. Symptoms:  Depressed/Irritable mood, worry, social withdrawal Problems Addressed:  Depressive thoughts, Sadness, Sleep issues, etc. Long Term Goals:  Pt to reduce overall level, frequency, and intensity of the feelings of depression/anxiety as evidenced by decreased irritability, negative self talk, and helpless feelings from 6 to 7 days/week to 0 to 1 days/week, per client report, for at least 3  consecutive months.  Progress: 10% Short Term Goals:  Pt to verbally express understanding of the relationship between feelings of depression/anxiety and their impact on thinking patterns and behaviors.  Pt to verbalize an understanding of the role that distorted thinking plays in creating fears, excessive worry, and ruminations.  Progress: 10% Target Date:  01/13/2023 Frequency:  Bi-weekly Modality:  Cognitive Behavioral Therapy Interventions by Therapist:  Therapist will use CBT, Mindfulness exercises,  Coping skills and Referrals, as needed by client. Client has verbally approved this treatment plan.  [name], [facility]

## 2022-05-19 ENCOUNTER — Ambulatory Visit: Payer: 59 | Admitting: Psychology
